# Patient Record
Sex: Female | Born: 2005 | Race: Black or African American | Hispanic: No | Marital: Single | Smoking: Never smoker
Health system: Southern US, Community
[De-identification: ages and names within clinical notes are randomized; demographics above are authoritative.]

## PROBLEM LIST (undated history)

## (undated) DIAGNOSIS — J45909 Unspecified asthma, uncomplicated: Secondary | ICD-10-CM

---
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
## 2024-01-03 ENCOUNTER — Other Ambulatory Visit: Payer: Self-pay

## 2024-01-09 ENCOUNTER — Ambulatory Visit

## 2024-01-17 ENCOUNTER — Other Ambulatory Visit (HOSPITAL_COMMUNITY): Payer: Self-pay

## 2024-01-25 ENCOUNTER — Other Ambulatory Visit (HOSPITAL_COMMUNITY): Payer: Self-pay

## 2024-01-25 ENCOUNTER — Ambulatory Visit (INDEPENDENT_AMBULATORY_CARE_PROVIDER_SITE_OTHER)

## 2024-01-25 DIAGNOSIS — J4550 Severe persistent asthma, uncomplicated: Secondary | ICD-10-CM

## 2024-02-06 ENCOUNTER — Ambulatory Visit

## 2024-02-06 DIAGNOSIS — J4550 Severe persistent asthma, uncomplicated: Secondary | ICD-10-CM | POA: Diagnosis not present

## 2024-02-08 ENCOUNTER — Ambulatory Visit

## 2024-02-13 ENCOUNTER — Other Ambulatory Visit: Payer: Self-pay

## 2024-02-13 ENCOUNTER — Other Ambulatory Visit (HOSPITAL_COMMUNITY): Payer: Self-pay

## 2024-02-13 NOTE — Progress Notes (Signed)
 Specialty Pharmacy Refill Coordination Note  Clear Bag Patient  Elizabeth Webster is a 18 y.o. female contacted today regarding refills of specialty medication(s) Dupilumab  (Dupixent )  Doses on hand: 0   Injection appointment: 02/20/24  Patient requested: Courier to Provider Office   Delivery date: 02/15/24   Verified address: [facility] [address] [place], [address] [place] [address]  Medication will be filled on 02/14/24.

## 2024-02-20 ENCOUNTER — Ambulatory Visit

## 2024-02-20 DIAGNOSIS — J4550 Severe persistent asthma, uncomplicated: Secondary | ICD-10-CM

## 2024-03-02 ENCOUNTER — Other Ambulatory Visit: Payer: Self-pay

## 2024-03-05 ENCOUNTER — Ambulatory Visit

## 2024-03-06 ENCOUNTER — Other Ambulatory Visit (HOSPITAL_COMMUNITY): Payer: Self-pay

## 2024-03-13 ENCOUNTER — Other Ambulatory Visit: Payer: Self-pay

## 2024-03-19 ENCOUNTER — Other Ambulatory Visit (HOSPITAL_COMMUNITY): Payer: Self-pay

## 2024-03-26 ENCOUNTER — Other Ambulatory Visit (HOSPITAL_COMMUNITY): Payer: Self-pay

## 2024-04-03 ENCOUNTER — Other Ambulatory Visit (HOSPITAL_COMMUNITY): Payer: Self-pay

## 2024-04-17 ENCOUNTER — Other Ambulatory Visit: Payer: Self-pay

## 2024-04-17 ENCOUNTER — Ambulatory Visit

## 2024-04-17 DIAGNOSIS — J4550 Severe persistent asthma, uncomplicated: Secondary | ICD-10-CM

## 2024-04-23 ENCOUNTER — Other Ambulatory Visit: Payer: Self-pay

## 2024-04-23 NOTE — Progress Notes (Signed)
 Specialty Pharmacy Refill Coordination Note  Elizabeth Webster is a 18 y.o. female assessed today regarding refills of clinic administered specialty medication(s) Dupilumab  (Dupixent )   Clinic requested Courier to Provider Office   Delivery date: 04/24/24   Verified address: [facility] [address] [place], [address] [place] [address]   Medication will be filled on: 04/23/24

## 2024-04-29 NOTE — Progress Notes (Deleted)
 "  Follow Up Note  RE: Elizabeth Webster MRN: [id number] DOB: 2006/04/02 Date of Office Visit: 04/30/2024  Referring provider: No ref. provider found Primary care provider: Patient, No Pcp Per  Chief Complaint: No chief complaint on file.  History of Present Illness: I had the pleasure of seeing Elizabeth Webster for a follow up visit at the [facility] on 04/30/2024. She is a 18 y.o. female, who is being followed for atopic dermatitis on Dupixent , asthma, allergic rhinitis and food allergies. Her previous allergy  office visit was on 12/26/2023 with [name] FNP. Today is a regular follow up visit.  Discussed the use of AI scribe software for clinical note transcription with the patient, who gave verbal consent to proceed.  History of Present Illness            Other atopic dermatitis Past history - flare-up for the past two weeks. Patient has been off Dupixent  for a few months and ran out of triamcinolone .  Interim history - doing much better but missed last Dupixent  injection due to insurance issues. Not interested in Rinvoq. Keep track of rashes and take pictures. Keep hydrated.  Use a humidifier in the bedroom.  Continue Dupixent  injections every 2 weeks - given today. Make sure you get your Dupixent  injection every 2 weeks on time.  Read about new injection for eczema  [email or web address] Continue proper skin care. Use fragrance free and dye free products. No dryer sheets or fabric softener.   Take hydroxyzine  25mg  1 hour before bedtime as needed for itching. For below the neck: Use triamcinolone  0.1% ointment twice a day as needed for rash flares. Do not use on the face, neck, armpits or groin area. Do not use more than 3 weeks in a row.  For neck and above: Use desonide  0.05% ointment twice a day as needed for mild rash flares - okay to use on the face, neck, groin area. Do not use more than 1 week at a time. For mild eczema: Use Eucrisa   (crisaborole ) 2% ointment twice a day on mild rash flares on the face and body. This is a non-steroid ointment.    Severe persistent asthma without complication Well controlled.  Today's spirometry showed moderate obstruction - asymptomatic. Daily controller medication(s):  Symbicort  160mcg 2 puffs twice a day with spacer and rinse mouth afterwards. Spiriva  2 puffs once a day.  Continue Dupixent  injections every 2 weeks.  May use albuterol  rescue inhaler 2 puffs or nebulizer every 4 to 6 hours as needed for shortness of breath, chest tightness, coughing, and wheezing. May use albuterol  rescue inhaler 2 puffs 5 to 15 minutes prior to strenuous physical activities. Monitor frequency of use - if you need to use it more than twice per week on a consistent basis let us  know.  Get spirometry at next visit.   Seasonal and perennial allergic rhinitis Asymptomatic with no daily medications. Continue avoidance measures directed toward pollen, molds, dust mite, cockroach, and pets. Use over the counter antihistamines such as Zyrtec (cetirizine), Claritin (loratadine), Allegra (fexofenadine), or Xyzal (levocetirizine) daily as needed. May take twice a day during allergy  flares. May switch antihistamines every few months. Use Flonase  (fluticasone ) nasal spray 1-2 sprays per nostril once a day as needed for nasal congestion.  Nasal saline spray (i.e., Simply Saline) or nasal saline lavage (i.e., NeilMed) is recommended as needed and prior to medicated nasal sprays.   Anaphylactic reaction due to food, subsequent encounter Continue to avoid peanuts, tree nuts,  seafood. For mild symptoms you can take over the counter antihistamines such as Benadryl 1-2 tablets = 25-50mg  and monitor symptoms closely. If symptoms worsen or if you have severe symptoms including breathing issues, throat closure, significant swelling, whole body hives, severe diarrhea and vomiting, lightheadedness then inject epinephrine  and seek  immediate medical care afterwards. Emergency action plan in place.  Consider repeat testing in 2025.  Assessment and Plan: [name] is a 18 y.o. female with: Atopic dermatitis-controlled Keep track of rashes and take pictures. Keep hydrated.  Use a humidifier in the bedroom. Continue Dupixent  injections every 2 weeks - given today. Make sure you get your Dupixent  injection every 2 weeks on time.  Continue proper skin care. Use fragrance free and dye free products. No dryer sheets or fabric softener.   Take hydroxyzine  25mg  1 hour before bedtime as needed for itching. For below the neck: Use triamcinolone  0.1% ointment twice a day as needed for rash flares. Do not use on the face, neck, armpits or groin area. Do not use more than 3 weeks in a row.  For neck and above: Use desonide  0.05% ointment twice a day as needed for mild rash flares - okay to use on the face, neck, groin area. Do not use more than 1 week at a time. For mild eczema: Use Eucrisa  (crisaborole ) 2% ointment twice a day on mild rash flares on the face and body. This is a non-steroid ointment.  If it burns, place the medication in the refrigerator.  Apply a thin layer of moisturizer and then apply the Eucrisa  on top of it.   Severe persistent asthma-controlled Daily controller medication(s):  Symbicort  160mcg 2 puffs twice a day with spacer and rinse mouth afterwards. Spiriva  2 puffs once a day.  Continue Dupixent  injections every 2 weeks.  May use albuterol  rescue inhaler 2 puffs or nebulizer every 4 to 6 hours as needed for shortness of breath, chest tightness, coughing, and wheezing. May use albuterol  rescue inhaler 2 puffs 5 to 15 minutes prior to strenuous physical activities. Monitor frequency of use - if you need to use it more than twice per week on a consistent basis let us  know.  Breathing control goals:  Full participation in all desired activities (may need albuterol  before activity) Albuterol  use two times or  less a week on average (not counting use with activity) Cough interfering with sleep two times or less a month Oral steroids no more than once a year No hospitalizations    Allergic rhinitis-controlled Continue avoidance measures directed toward pollen, molds, dust mite, cockroach, and pets. Use over the counter antihistamines such as Zyrtec (cetirizine), Claritin (loratadine), Allegra (fexofenadine), or Xyzal (levocetirizine) daily as needed. May take twice a day during allergy  flares. May switch antihistamines every few months. Use Flonase  (fluticasone ) nasal spray 1-2 sprays per nostril once a day as needed for nasal congestion.  Nasal saline spray (i.e., Simply Saline) or nasal saline lavage (i.e., NeilMed) is recommended as needed and prior to medicated nasal sprays.   Food allergies -stable Continue to avoid peanuts, tree nuts, seafood. For mild symptoms you can take over the counter antihistamines such as Benadryl 1-2 tablets = 25-50mg  and monitor symptoms closely. If symptoms worsen or if you have severe symptoms including breathing issues, throat closure, significant swelling, whole body hives, severe diarrhea and vomiting, lightheadedness then inject epinephrine  and seek immediate medical care afterwards. Emergency action plan in place.  Consider updating your skin testing to select foods (peanuts, tree nuts, and  seafood).  You will need to be off all antihistamines 3 days prior to this appointment Assessment and Plan              No follow-ups on file.  No orders of the defined types were placed in this encounter.  Lab Orders  No laboratory test(s) ordered today    Diagnostics: Spirometry:  Tracings reviewed. Her effort: {Blank single:19197::Good reproducible efforts.,It was hard to get consistent efforts and there is a question as to whether this reflects a maximal maneuver.,Poor effort, data can not be interpreted.} FVC: ***L FEV1: ***L, ***% predicted FEV1/FVC  ratio: ***% Interpretation: {Blank single:19197::Spirometry consistent with mild obstructive disease,Spirometry consistent with moderate obstructive disease,Spirometry consistent with severe obstructive disease,Spirometry consistent with possible restrictive disease,Spirometry consistent with mixed obstructive and restrictive disease,Spirometry uninterpretable due to technique,Spirometry consistent with normal pattern,No overt abnormalities noted given today's efforts}.  Please see scanned spirometry results for details.  Skin Testing: {Blank single:19197::Select foods,Environmental allergy  panel,Environmental allergy  panel and select foods,Food allergy  panel,None,Deferred due to recent antihistamines use}. *** Results discussed with patient/family.   Medication List:  Current Outpatient Medications  Medication Sig Dispense Refill   budesonide -formoterol  (SYMBICORT ) 160-4.5 MCG/ACT inhaler INHALE 2 PUFFS TWICE A DAY WITH SPACER TO HELP PREVENT COUGH AND WHEEZE. RINSE MOUTH OUT AFTER. 10.2 each 5   Crisaborole  (EUCRISA ) 2 % OINT Apply 1 Application topically 2 (two) times daily as needed (mild rash). 100 g 3   desonide  (DESOWEN ) 0.05 % ointment Apply 1 Application topically 2 (two) times daily as needed (mild rash flare). Okay to use on the face, neck, groin area. Do not use more than 1 week at a time. 60 g 3   dupilumab  (DUPIXENT ) 300 MG/2ML prefilled syringe Inject 300 mg into the skin every 14 (fourteen) days. 4 mL 11   EPIPEN  2-PAK 0.3 MG/0.3ML SOAJ injection Inject 0.3 mg into the muscle as needed for anaphylaxis. 2 each 1   fluticasone  (FLONASE ) 50 MCG/ACT nasal spray PLACE 1-2 SPRAYS INTO BOTH NOSTRILS DAILY. 16 mL 5   hydrOXYzine  (ATARAX ) 25 MG tablet Take 1 tablet (25 mg total) by mouth at bedtime as needed for itching. 30 tablet 3   Tiotropium Bromide Monohydrate  (SPIRIVA  RESPIMAT) 1.25 MCG/ACT AERS Inhale 2 puffs once a day 4 g 5   triamcinolone  ointment  (KENALOG ) 0.1 % Apply 1 Application topically 2 (two) times daily as needed (rash flare). Do not use on the face, neck, armpits or groin area. Do not use more than 2 weeks in a row. 80 g 3   VENTOLIN  HFA 108 (90 Base) MCG/ACT inhaler INHALE 2 PUFFS INTO THE LUNGS EVERY 4 HOURS AS NEEDED FOR WHEEZING OR SHORTNESS OF BREATH. 18 each 1   Current Facility-Administered Medications  Medication Dose Route Frequency Provider Last Rate Last Admin   dupilumab  (DUPIXENT ) prefilled syringe 300 mg  300 mg Subcutaneous Q14 Days Cheryl Reusing, FNP   300 mg at 04/17/24 1757   Allergies: Allergies[1] I reviewed her past medical history, social history, family history, and environmental history and no significant changes have been reported from her previous visit.  Review of Systems  Constitutional:  Negative for appetite change, chills, fever and unexpected weight change.  HENT:  Negative for congestion and rhinorrhea.   Eyes:  Negative for itching.  Respiratory:  Negative for cough, chest tightness, shortness of breath and wheezing.   Cardiovascular:  Negative for chest pain.  Gastrointestinal:  Negative for abdominal pain.  Genitourinary:  Negative for difficulty urinating.  Skin:  Positive for rash.  Neurological:  Negative for headaches.    Objective: There were no vitals taken for this visit. There is no height or weight on file to calculate BMI. Physical Exam Vitals and nursing note reviewed.  Constitutional:      Appearance: Normal appearance. She is well-developed.  HENT:     Head: Normocephalic and atraumatic.     Right Ear: Tympanic membrane and external ear normal.     Left Ear: Tympanic membrane and external ear normal.     Nose: Nose normal.     Mouth/Throat:     Mouth: Mucous membranes are moist.     Pharynx: Oropharynx is clear.  Eyes:     Conjunctiva/sclera: Conjunctivae normal.  Cardiovascular:     Rate and Rhythm: Normal rate and regular rhythm.     Heart sounds: Normal  heart sounds. No murmur heard.    No friction rub. No gallop.  Pulmonary:     Effort: Pulmonary effort is normal.     Breath sounds: Normal breath sounds. No wheezing, rhonchi or rales.  Musculoskeletal:     Cervical back: Neck supple.  Skin:    General: Skin is warm.     Findings: Rash present.     Comments: Eczematous patches on wrists area b/l.  Neurological:     Mental Status: She is alert and oriented to person, place, and time.  Psychiatric:        Behavior: Behavior normal.    Previous notes and tests were reviewed. The plan was reviewed with the patient/family, and all questions/concerned were addressed.  It was my pleasure to see Jaice today and participate in her care. Please feel free to contact me with any questions or concerns.  Sincerely,  Orlan Cramp, DO Allergy  & Immunology  [facility]  [facility] office: [phone] [facility] office: [phone]    [1]  Allergies Allergen Reactions   Fish Allergy  Other (See Comments)    asthma   Other Other (See Comments)    Allergy  to trees, grass, and cats per allergy  test   Peanut -Containing Drug Products     asthma   Shellfish Allergy  Other (See Comments)    Asthma    "

## 2024-04-30 ENCOUNTER — Ambulatory Visit: Admitting: Allergy

## 2024-04-30 DIAGNOSIS — J4550 Severe persistent asthma, uncomplicated: Secondary | ICD-10-CM

## 2024-04-30 DIAGNOSIS — L2089 Other atopic dermatitis: Secondary | ICD-10-CM

## 2024-04-30 DIAGNOSIS — J302 Other seasonal allergic rhinitis: Secondary | ICD-10-CM

## 2024-04-30 DIAGNOSIS — T7800XD Anaphylactic reaction due to unspecified food, subsequent encounter: Secondary | ICD-10-CM

## 2024-05-01 ENCOUNTER — Ambulatory Visit

## 2024-05-09 ENCOUNTER — Ambulatory Visit (INDEPENDENT_AMBULATORY_CARE_PROVIDER_SITE_OTHER)

## 2024-05-09 DIAGNOSIS — J4550 Severe persistent asthma, uncomplicated: Secondary | ICD-10-CM

## 2024-05-14 ENCOUNTER — Other Ambulatory Visit (HOSPITAL_COMMUNITY): Payer: Self-pay

## 2024-05-15 ENCOUNTER — Other Ambulatory Visit: Payer: Self-pay

## 2024-05-16 ENCOUNTER — Ambulatory Visit: Admitting: Allergy

## 2024-05-16 NOTE — Progress Notes (Unsigned)
 "  Follow Up Note  RE: Elizabeth Webster MRN: [id number] DOB: 2005-12-28 Date of Office Visit: 05/16/2024  Referring provider: No ref. provider found Primary care provider: Patient, No Pcp Per  Chief Complaint: No chief complaint on file.  History of Present Illness: I had the pleasure of seeing Elizabeth Webster for a follow up visit at the [facility] on 05/16/2024. She is a 19 y.o. female, who is being followed for atopic dermatitis on Dupixent , asthma, allergic rhinitis, food allergies. Her previous allergy  office visit was on 12/26/2023 with [name] FNP. Today is a regular follow up visit.  Discussed the use of AI scribe software for clinical note transcription with the patient, who gave verbal consent to proceed.  History of Present Illness            Other atopic dermatitis Past history - flare-up for the past two weeks. Patient has been off Dupixent  for a few months and ran out of triamcinolone .  Interim history - doing much better but missed last Dupixent  injection due to insurance issues. Not interested in Rinvoq. Keep track of rashes and take pictures. Keep hydrated.  Use a humidifier in the bedroom.  Continue Dupixent  injections every 2 weeks - given today. Make sure you get your Dupixent  injection every 2 weeks on time.  Read about new injection for eczema  [email or web address] Continue proper skin care. Use fragrance free and dye free products. No dryer sheets or fabric softener.   Take hydroxyzine  25mg  1 hour before bedtime as needed for itching. For below the neck: Use triamcinolone  0.1% ointment twice a day as needed for rash flares. Do not use on the face, neck, armpits or groin area. Do not use more than 3 weeks in a row.  For neck and above: Use desonide  0.05% ointment twice a day as needed for mild rash flares - okay to use on the face, neck, groin area. Do not use more than 1 week at a time. For mild eczema: Use Eucrisa   (crisaborole ) 2% ointment twice a day on mild rash flares on the face and body. This is a non-steroid ointment.    Severe persistent asthma without complication Well controlled.  Today's spirometry showed moderate obstruction - asymptomatic. Daily controller medication(s):  Symbicort  160mcg 2 puffs twice a day with spacer and rinse mouth afterwards. Spiriva  2 puffs once a day.  Continue Dupixent  injections every 2 weeks.  May use albuterol  rescue inhaler 2 puffs or nebulizer every 4 to 6 hours as needed for shortness of breath, chest tightness, coughing, and wheezing. May use albuterol  rescue inhaler 2 puffs 5 to 15 minutes prior to strenuous physical activities. Monitor frequency of use - if you need to use it more than twice per week on a consistent basis let us  know.  Get spirometry at next visit.   Seasonal and perennial allergic rhinitis Asymptomatic with no daily medications. Continue avoidance measures directed toward pollen, molds, dust mite, cockroach, and pets. Use over the counter antihistamines such as Zyrtec (cetirizine), Claritin (loratadine), Allegra (fexofenadine), or Xyzal (levocetirizine) daily as needed. May take twice a day during allergy  flares. May switch antihistamines every few months. Use Flonase  (fluticasone ) nasal spray 1-2 sprays per nostril once a day as needed for nasal congestion.  Nasal saline spray (i.e., Simply Saline) or nasal saline lavage (i.e., NeilMed) is recommended as needed and prior to medicated nasal sprays.   Anaphylactic reaction due to food, subsequent encounter Continue to avoid peanuts, tree nuts, seafood.  For mild symptoms you can take over the counter antihistamines such as Benadryl 1-2 tablets = 25-50mg  and monitor symptoms closely. If symptoms worsen or if you have severe symptoms including breathing issues, throat closure, significant swelling, whole body hives, severe diarrhea and vomiting, lightheadedness then inject epinephrine  and seek  immediate medical care afterwards. Emergency action plan in place.  Consider repeat testing in 2025.  Assessment and Plan: Elizabeth Webster is a 19 y.o. female with: Atopic dermatitis-controlled Keep track of rashes and take pictures. Keep hydrated.  Use a humidifier in the bedroom. Continue Dupixent  injections every 2 weeks - given today. Make sure you get your Dupixent  injection every 2 weeks on time.  Continue proper skin care. Use fragrance free and dye free products. No dryer sheets or fabric softener.   Take hydroxyzine  25mg  1 hour before bedtime as needed for itching. For below the neck: Use triamcinolone  0.1% ointment twice a day as needed for rash flares. Do not use on the face, neck, armpits or groin area. Do not use more than 3 weeks in a row.  For neck and above: Use desonide  0.05% ointment twice a day as needed for mild rash flares - okay to use on the face, neck, groin area. Do not use more than 1 week at a time. For mild eczema: Use Eucrisa  (crisaborole ) 2% ointment twice a day on mild rash flares on the face and body. This is a non-steroid ointment.  If it burns, place the medication in the refrigerator.  Apply a thin layer of moisturizer and then apply the Eucrisa  on top of it.   Severe persistent asthma-controlled Daily controller medication(s):  Symbicort  160mcg 2 puffs twice a day with spacer and rinse mouth afterwards. Spiriva  2 puffs once a day.  Continue Dupixent  injections every 2 weeks.  May use albuterol  rescue inhaler 2 puffs or nebulizer every 4 to 6 hours as needed for shortness of breath, chest tightness, coughing, and wheezing. May use albuterol  rescue inhaler 2 puffs 5 to 15 minutes prior to strenuous physical activities. Monitor frequency of use - if you need to use it more than twice per week on a consistent basis let us  know.  Breathing control goals:  Full participation in all desired activities (may need albuterol  before activity) Albuterol  use two times or  less a week on average (not counting use with activity) Cough interfering with sleep two times or less a month Oral steroids no more than once a year No hospitalizations    Allergic rhinitis-controlled Continue avoidance measures directed toward pollen, molds, dust mite, cockroach, and pets. Use over the counter antihistamines such as Zyrtec (cetirizine), Claritin (loratadine), Allegra (fexofenadine), or Xyzal (levocetirizine) daily as needed. May take twice a day during allergy  flares. May switch antihistamines every few months. Use Flonase  (fluticasone ) nasal spray 1-2 sprays per nostril once a day as needed for nasal congestion.  Nasal saline spray (i.e., Simply Saline) or nasal saline lavage (i.e., NeilMed) is recommended as needed and prior to medicated nasal sprays.   Food allergies -stable Continue to avoid peanuts, tree nuts, seafood. For mild symptoms you can take over the counter antihistamines such as Benadryl 1-2 tablets = 25-50mg  and monitor symptoms closely. If symptoms worsen or if you have severe symptoms including breathing issues, throat closure, significant swelling, whole body hives, severe diarrhea and vomiting, lightheadedness then inject epinephrine  and seek immediate medical care afterwards. Emergency action plan in place.  Consider updating your skin testing to select foods (peanuts, tree nuts, and seafood).  You will need to be off all antihistamines 3 days prior to this appointment Assessment and Plan              No follow-ups on file.  No orders of the defined types were placed in this encounter.  Lab Orders  No laboratory test(s) ordered today    Diagnostics: Spirometry:  Tracings reviewed. Her effort: {Blank single:19197::Good reproducible efforts.,It was hard to get consistent efforts and there is a question as to whether this reflects a maximal maneuver.,Poor effort, data can not be interpreted.} FVC: ***L FEV1: ***L, ***% predicted FEV1/FVC  ratio: ***% Interpretation: {Blank single:19197::Spirometry consistent with mild obstructive disease,Spirometry consistent with moderate obstructive disease,Spirometry consistent with severe obstructive disease,Spirometry consistent with possible restrictive disease,Spirometry consistent with mixed obstructive and restrictive disease,Spirometry uninterpretable due to technique,Spirometry consistent with normal pattern,No overt abnormalities noted given today's efforts}.  Please see scanned spirometry results for details.  Skin Testing: {Blank single:19197::Select foods,Environmental allergy  panel,Environmental allergy  panel and select foods,Food allergy  panel,None,Deferred due to recent antihistamines use}. *** Results discussed with patient/family.   Medication List:  Current Outpatient Medications  Medication Sig Dispense Refill   budesonide -formoterol  (SYMBICORT ) 160-4.5 MCG/ACT inhaler INHALE 2 PUFFS TWICE A DAY WITH SPACER TO HELP PREVENT COUGH AND WHEEZE. RINSE MOUTH OUT AFTER. 10.2 each 5   Crisaborole  (EUCRISA ) 2 % OINT Apply 1 Application topically 2 (two) times daily as needed (mild rash). 100 g 3   desonide  (DESOWEN ) 0.05 % ointment Apply 1 Application topically 2 (two) times daily as needed (mild rash flare). Okay to use on the face, neck, groin area. Do not use more than 1 week at a time. 60 g 3   dupilumab  (DUPIXENT ) 300 MG/2ML prefilled syringe Inject 300 mg into the skin every 14 (fourteen) days. 4 mL 11   EPIPEN  2-PAK 0.3 MG/0.3ML SOAJ injection Inject 0.3 mg into the muscle as needed for anaphylaxis. 2 each 1   fluticasone  (FLONASE ) 50 MCG/ACT nasal spray PLACE 1-2 SPRAYS INTO BOTH NOSTRILS DAILY. 16 mL 5   hydrOXYzine  (ATARAX ) 25 MG tablet Take 1 tablet (25 mg total) by mouth at bedtime as needed for itching. 30 tablet 3   Tiotropium Bromide Monohydrate  (SPIRIVA  RESPIMAT) 1.25 MCG/ACT AERS Inhale 2 puffs once a day 4 g 5   triamcinolone   ointment (KENALOG ) 0.1 % Apply 1 Application topically 2 (two) times daily as needed (rash flare). Do not use on the face, neck, armpits or groin area. Do not use more than 2 weeks in a row. 80 g 3   VENTOLIN  HFA 108 (90 Base) MCG/ACT inhaler INHALE 2 PUFFS INTO THE LUNGS EVERY 4 HOURS AS NEEDED FOR WHEEZING OR SHORTNESS OF BREATH. 18 each 1   Current Facility-Administered Medications  Medication Dose Route Frequency Provider Last Rate Last Admin   dupilumab  (DUPIXENT ) prefilled syringe 300 mg  300 mg Subcutaneous Q14 Days Cheryl Reusing, FNP   300 mg at 05/09/24 1437   Allergies: Allergies[1] I reviewed her past medical history, social history, family history, and environmental history and no significant changes have been reported from her previous visit.  Review of Systems  Constitutional:  Negative for appetite change, chills, fever and unexpected weight change.  HENT:  Negative for congestion and rhinorrhea.   Eyes:  Negative for itching.  Respiratory:  Negative for cough, chest tightness, shortness of breath and wheezing.   Cardiovascular:  Negative for chest pain.  Gastrointestinal:  Negative for abdominal pain.  Genitourinary:  Negative for difficulty urinating.  Skin:  Positive  for rash.  Neurological:  Negative for headaches.    Objective: There were no vitals taken for this visit. There is no height or weight on file to calculate BMI. Physical Exam Vitals and nursing note reviewed.  Constitutional:      Appearance: Normal appearance. She is well-developed.  HENT:     Head: Normocephalic and atraumatic.     Right Ear: Tympanic membrane and external ear normal.     Left Ear: Tympanic membrane and external ear normal.     Nose: Nose normal.     Mouth/Throat:     Mouth: Mucous membranes are moist.     Pharynx: Oropharynx is clear.  Eyes:     Conjunctiva/sclera: Conjunctivae normal.  Cardiovascular:     Rate and Rhythm: Normal rate and regular rhythm.     Heart  sounds: Normal heart sounds. No murmur heard.    No friction rub. No gallop.  Pulmonary:     Effort: Pulmonary effort is normal.     Breath sounds: Normal breath sounds. No wheezing, rhonchi or rales.  Musculoskeletal:     Cervical back: Neck supple.  Skin:    General: Skin is warm.     Findings: Rash present.     Comments: Eczematous patches on wrists area b/l.  Neurological:     Mental Status: She is alert and oriented to person, place, and time.  Psychiatric:        Behavior: Behavior normal.   Previous notes and tests were reviewed. The plan was reviewed with the patient/family, and all questions/concerned were addressed.  It was my pleasure to see Elizabeth Webster today and participate in her care. Please feel free to contact me with Elizabeth Webster questions or concerns.  Sincerely,  Elizabeth Cramp, DO Allergy  & Immunology  [facility]  [facility] office: [phone] [facility] office: [phone]     [1] Allergies Allergen Reactions   Fish Allergy  Other (See Comments)    asthma   Other Other (See Comments)    Allergy  to trees, grass, and cats per allergy  test   Peanut -Containing Drug Products     asthma   Shellfish Allergy  Other (See Comments)    Asthma   "

## 2024-05-24 ENCOUNTER — Telehealth: Payer: Self-pay | Admitting: Family

## 2024-05-24 NOTE — Telephone Encounter (Signed)
 Left voicemail to give the office a call back to schedule Dupixent reapproval appointment.

## 2024-05-25 ENCOUNTER — Ambulatory Visit

## 2024-05-29 ENCOUNTER — Other Ambulatory Visit: Payer: Self-pay
